# Patient Record
Sex: Female | Born: 1960 | Race: White | Hispanic: No | Marital: Married | Smoking: Current every day smoker
Health system: Southern US, Community
[De-identification: ages and names within clinical notes are randomized; demographics above are authoritative.]

## PROBLEM LIST (undated history)

## (undated) DIAGNOSIS — E119 Type 2 diabetes mellitus without complications: Secondary | ICD-10-CM

## (undated) DIAGNOSIS — M1990 Unspecified osteoarthritis, unspecified site: Secondary | ICD-10-CM

## (undated) DIAGNOSIS — E785 Hyperlipidemia, unspecified: Secondary | ICD-10-CM

## (undated) DIAGNOSIS — I10 Essential (primary) hypertension: Secondary | ICD-10-CM

## (undated) DIAGNOSIS — J449 Chronic obstructive pulmonary disease, unspecified: Secondary | ICD-10-CM

## (undated) HISTORY — PX: ABDOMINAL HYSTERECTOMY: SHX81

## (undated) HISTORY — PX: APPENDECTOMY: SHX54

## (undated) HISTORY — PX: TUBAL LIGATION: SHX77

## (undated) HISTORY — PX: CHOLECYSTECTOMY: SHX55

---

## 2015-03-21 DIAGNOSIS — D124 Benign neoplasm of descending colon: Secondary | ICD-10-CM | POA: Insufficient documentation

## 2016-07-15 DIAGNOSIS — J449 Chronic obstructive pulmonary disease, unspecified: Secondary | ICD-10-CM | POA: Insufficient documentation

## 2017-05-14 DIAGNOSIS — I10 Essential (primary) hypertension: Secondary | ICD-10-CM | POA: Insufficient documentation

## 2017-05-14 DIAGNOSIS — Z720 Tobacco use: Secondary | ICD-10-CM | POA: Insufficient documentation

## 2018-03-15 DIAGNOSIS — E119 Type 2 diabetes mellitus without complications: Secondary | ICD-10-CM | POA: Insufficient documentation

## 2018-06-02 DIAGNOSIS — G2581 Restless legs syndrome: Secondary | ICD-10-CM | POA: Insufficient documentation

## 2018-07-06 DIAGNOSIS — M170 Bilateral primary osteoarthritis of knee: Secondary | ICD-10-CM | POA: Insufficient documentation

## 2019-04-26 ENCOUNTER — Emergency Department (HOSPITAL_COMMUNITY): Payer: BC Managed Care – PPO

## 2019-04-26 ENCOUNTER — Emergency Department (HOSPITAL_COMMUNITY)
Admission: EM | Admit: 2019-04-26 | Discharge: 2019-04-26 | Disposition: A | Discharge status: Home / Self Care | Payer: BC Managed Care – PPO | Attending: Emergency Medicine | Admitting: Emergency Medicine

## 2019-04-26 ENCOUNTER — Encounter (HOSPITAL_COMMUNITY): Payer: Self-pay

## 2019-04-26 ENCOUNTER — Other Ambulatory Visit: Payer: Self-pay

## 2019-04-26 DIAGNOSIS — Z955 Presence of coronary angioplasty implant and graft: Secondary | ICD-10-CM | POA: Insufficient documentation

## 2019-04-26 DIAGNOSIS — R072 Precordial pain: Secondary | ICD-10-CM

## 2019-04-26 DIAGNOSIS — I259 Chronic ischemic heart disease, unspecified: Secondary | ICD-10-CM | POA: Insufficient documentation

## 2019-04-26 DIAGNOSIS — R002 Palpitations: Secondary | ICD-10-CM | POA: Diagnosis not present

## 2019-04-26 HISTORY — DX: Unspecified osteoarthritis, unspecified site: M19.90

## 2019-04-26 HISTORY — DX: Type 2 diabetes mellitus without complications: E11.9

## 2019-04-26 HISTORY — DX: Essential (primary) hypertension: I10

## 2019-04-26 HISTORY — DX: Chronic obstructive pulmonary disease, unspecified: J44.9

## 2019-04-26 HISTORY — DX: Hyperlipidemia, unspecified: E78.5

## 2019-04-26 LAB — CBC WITH DIFFERENTIAL/PLATELET
Abs Immature Granulocytes: 0.04 10*3/uL (ref 0.00–0.07)
Basophils Absolute: 0.1 10*3/uL (ref 0.0–0.1)
Basophils Relative: 1 %
Eosinophils Absolute: 0.1 10*3/uL (ref 0.0–0.5)
Eosinophils Relative: 1 %
HCT: 38 % (ref 36.0–46.0)
Hemoglobin: 12.2 g/dL (ref 12.0–15.0)
Immature Granulocytes: 0 %
Lymphocytes Relative: 17 %
Lymphs Abs: 2.2 10*3/uL (ref 0.7–4.0)
MCH: 30.7 pg (ref 26.0–34.0)
MCHC: 32.1 g/dL (ref 30.0–36.0)
MCV: 95.5 fL (ref 80.0–100.0)
Monocytes Absolute: 0.9 10*3/uL (ref 0.1–1.0)
Monocytes Relative: 7 %
Neutro Abs: 9.6 10*3/uL — ABNORMAL HIGH (ref 1.7–7.7)
Neutrophils Relative %: 74 %
Platelets: 372 10*3/uL (ref 150–400)
RBC: 3.98 MIL/uL (ref 3.87–5.11)
RDW: 13.3 % (ref 11.5–15.5)
WBC: 13 10*3/uL — ABNORMAL HIGH (ref 4.0–10.5)
nRBC: 0 % (ref 0.0–0.2)

## 2019-04-26 LAB — BASIC METABOLIC PANEL
Anion gap: 12 (ref 5–15)
BUN: 10 mg/dL (ref 6–20)
CO2: 22 mmol/L (ref 22–32)
Calcium: 9.3 mg/dL (ref 8.9–10.3)
Chloride: 97 mmol/L — ABNORMAL LOW (ref 98–111)
Creatinine, Ser: 0.94 mg/dL (ref 0.44–1.00)
GFR calc Af Amer: >60 mL/min (ref >60)
GFR calc non Af Amer: >60 mL/min (ref >60)
Glucose, Bld: 175 mg/dL — ABNORMAL HIGH (ref 70–99)
Potassium: 3.3 mmol/L — ABNORMAL LOW (ref 3.5–5.1)
Sodium: 131 mmol/L — ABNORMAL LOW (ref 135–145)

## 2019-04-26 LAB — TROPONIN I (HIGH SENSITIVITY)
Troponin I (High Sensitivity): <2 ng/L (ref <18)
Troponin I (High Sensitivity): 2 ng/L (ref <18)

## 2019-04-26 NOTE — ED Triage Notes (Signed)
Pt c/o CP that started around 0100 . 324 aspirin given. No ntg due to being slightly hypotensive. Slight ST depression on lead 2 on ems monitor.

## 2019-04-26 NOTE — ED Provider Notes (Signed)
[facility] EMERGENCY DEPARTMENT Provider Note   CSN: [id number] Arrival date & time: 04/26/19  0256     History Chief Complaint  Patient presents with  . Chest Pain    Molly Oneill is a 58 y.o. female.  The history is provided by the patient.  Chest Pain Pain location:  Substernal area Pain quality comment:  "heaviness" Pain radiates to:  Does not radiate Pain severity:  Severe Onset quality:  Sudden Timing:  Constant Progression:  Improving Chronicity:  New Relieved by:  Nothing Worsened by:  Nothing Ineffective treatments:  Aspirin Associated symptoms: diaphoresis, nausea, shortness of breath, syncope and vomiting     HPI: A 58 year old patient with a history of treated diabetes, hypertension and hypercholesterolemia presents for evaluation of chest pain. Initial onset of pain was less than one hour ago. The patient's chest pain is described as heaviness/pressure/tightness and is not worse with exertion. The patient complains of nausea. The patient's chest pain is middle- or left-sided, is not well-localized, is not sharp and does not radiate to the arms/jaw/neck. The patient denies diaphoresis. The patient has smoked in the past 90 days and has a family history of coronary artery disease in a first-degree relative with onset less than age 23. The patient has no history of stroke, has no history of peripheral artery disease and does not have an elevated BMI (>=30).  Patient reports she had sudden onset of chest heaviness, shortness of breath and vomiting.  She also reports diaphoresis.  She reports she may have "blacked out "for a brief period of time.  She is now feeling improved.  She was not given nitroglycerin by EMS due to mild hypotension.  Questionable ST depression on lead II on EMS monitor Patient reports feeling improved.  She has not had this recently.  No known history of CAD Past Medical History:  Diagnosis Date  . Arthritis   . COPD (chronic obstructive  pulmonary disease) ([place])   . Diabetes mellitus without complication ([place])   . Hyperlipidemia   . Hypertension     There are no problems to display for this patient.   Past Surgical History:  Procedure Laterality Date  . ABDOMINAL HYSTERECTOMY    . APPENDECTOMY    . CHOLECYSTECTOMY    . TUBAL LIGATION       OB History   No obstetric history on file.     History reviewed. No pertinent family history.  Social History   Tobacco Use  . Smoking status: Current Every Day Smoker  . Smokeless tobacco: Never Used  Substance Use Topics  . Alcohol use: Never  . Drug use: Never    Home Medications Prior to Admission medications   Not on File    Allergies    Patient has no allergy information on record.  Review of Systems   Review of Systems  Constitutional: Positive for diaphoresis.  Respiratory: Positive for shortness of breath.   Cardiovascular: Positive for chest pain and syncope.  Gastrointestinal: Positive for nausea and vomiting.  Neurological: Positive for light-headedness.  All other systems reviewed and are negative.   Physical Exam Updated Vital Signs BP (!) 144/63 (BP Location: Left Arm)   Pulse 68   Temp 97.8 F (36.6 C) (Oral)   Resp 14   Ht 1.626 m (5\' 4" )   Wt 76.7 kg   SpO2 99%   BMI 29.01 kg/m   Physical Exam CONSTITUTIONAL: Well developed/well nourished HEAD: Normocephalic/atraumatic EYES: EOMI ENMT: Poor dentition NECK: supple no  meningeal signs SPINE/BACK:entire spine nontender CV: S1/S2 noted, no murmurs/rubs/gallops noted LUNGS: Lungs are clear to auscultation bilaterally, no apparent distress ABDOMEN: soft, nontender, no rebound or guarding, bowel sounds noted throughout abdomen GU:no cva tenderness NEURO: Pt is awake/alert/appropriate, moves all extremitiesx4.  No facial droop.   EXTREMITIES: pulses normal/equalx4, full ROM SKIN: warm, color normal PSYCH: no abnormalities of mood noted, alert and oriented to situation  ED  Results / Procedures / Treatments   Labs (all labs ordered are listed, but only abnormal results are displayed) Labs Reviewed  BASIC METABOLIC PANEL - Abnormal; Notable for the following components:      Result Value   Sodium 131 (*)    Potassium 3.3 (*)    Chloride 97 (*)    Glucose, Bld 175 (*)    All other components within normal limits  CBC WITH DIFFERENTIAL/PLATELET - Abnormal; Notable for the following components:   WBC 13.0 (*)    Neutro Abs 9.6 (*)    All other components within normal limits  TROPONIN I (HIGH SENSITIVITY)  TROPONIN I (HIGH SENSITIVITY)    EKG EKG Interpretation  Date/Time:  Tuesday April 26 2019 03:03:27 EST Ventricular Rate:  68 PR Interval:    QRS Duration: 89 QT Interval:  417 QTC Calculation: 444 R Axis:   76 Text Interpretation: Sinus rhythm Abnormal ekg No previous ECGs available Confirmed by Zadie RhineWickline, Tonianne Fine ([id number]) on 04/26/2019 3:06:25 AM   Radiology DG Chest Port 1 View  Result Date: 04/26/2019 CLINICAL DATA:  Shortness of breath and chest pain EXAM: PORTABLE CHEST 1 VIEW COMPARISON:  04/04/2019 FINDINGS: The heart size and mediastinal contours are within normal limits. Both lungs are clear. The visualized skeletal structures are unremarkable. IMPRESSION: No active disease. Electronically Signed   By: Deatra RobinsonKevin  Herman M.D.   On: 04/26/2019 03:26    Procedures Procedures   Medications Ordered in ED Medications - No data to display  ED Course  I have reviewed the triage vital signs and the nursing notes.  Pertinent labs results that were available during my care of the patient were reviewed by me and considered in my medical decision making (see chart for details).    MDM Rules/Calculators/A&P HEAR Score: 5                    3:45 AM Patient with concerning history for ACS.  No previous records in our system.?ST depression in lead II, no STEMI noted 4:27 AM Patient well-appearing.  Initial troponin is negative. We will  continue to monitor. She is in no acute distress at this time 6:52 AM Repeat ekg unchanged  EKG Interpretation  Date/Time:  Tuesday April 26 2019 06:07:51 EST Ventricular Rate:  79 PR Interval:    QRS Duration: 85 QT Interval:  411 QTC Calculation: 472 R Axis:   74 Text Interpretation: Sinus rhythm Interpretation limited secondary to artifact No significant change since last tracing Confirmed by Zadie RhineWickline, Joahan Swatzell ([id number]) on 04/26/2019 6:18:45 AM      Patient slept through the ER stay.  Repeat troponin is unremarkable Since patient has had no recurrent chest pain, heart score of 5 with 2 negative troponins, she will be appropriate for discharge.  Patient is requesting discharge home. Low suspicion for ACS/dissection/PE.  However given her risk factors, I do feel outpatient follow-up with cardiology is warranted.  I strongly encourage patient to quit smoking. We discussed strict return precautions.  Teach back utilized with patient Final Clinical Impression(s) / ED Diagnoses Final diagnoses:  Precordial pain    Rx / DC Orders ED Discharge Orders    None       Zadie Rhine, MD 04/26/19 [phone]

## 2019-04-26 NOTE — Discharge Instructions (Signed)

## 2019-05-09 NOTE — Progress Notes (Signed)
CARDIOLOGY CONSULT NOTE       Patient ID: Molly Oneill MRN: [id number] DOB/AGE: 1960/06/27 59 y.o.  Admit date: (Not on file) Referring Physician: Bebe Shaggy [facility] Primary Physician: Salley Slaughter, NP Primary Cardiologist: New Reason for Consultation: Chest Pain  Active Problems:   * No active hospital problems. *   HPI:  59 y.o. CRFls include DM, HLD and HTN smokeing and family history of premature CAD  Seen in [facility] ER 04/26/19. Had heaviness/tightness in middle to left side of chest no radiation acute onset Associated with dyspnea and vomiting as well as diaphoresis Dizzy with low BP EMS did not give nitro Symptoms resolved spontaneously Reviewed all data from ER CXR with NAD HS troponin negative x 2 ECG no acute changes normal T waves and flat ST segments throughout NO ST depression or elevation. No old one to compare WBC elevated at 13 K 3.3 BS 175 Heart Score 5 Note indicates sleeping through ER visit and wanting to go home  Has not had recurrent symptoms since ER Smoking since age 44 sometimes 3 ppd lately only 1 ppd   ROS All other systems reviewed and negative except as noted above  Past Medical History:  Diagnosis Date  . Arthritis   . COPD (chronic obstructive pulmonary disease) ([facility])   . Diabetes mellitus without complication ([facility])   . Hyperlipidemia   . Hypertension     Family History  Problem Relation Age of Onset  . Heart failure Mother   . Diabetes Mother   . Hypertension Father     Social History   Socioeconomic History  . Marital status: Married    Spouse name: Not on file  . Number of children: Not on file  . Years of education: Not on file  . Highest education level: Not on file  Occupational History  . Not on file  Tobacco Use  . Smoking status: Current Every Day Smoker  . Smokeless tobacco: Never Used  Substance and Sexual Activity  . Alcohol use: Never  . Drug use: Never  . Sexual activity: Not Currently  Other Topics Concern  . Not on  file  Social History Narrative  . Not on file   Social Determinants of Health   Financial Resource Strain:   . Difficulty of Paying Living Expenses: Not on file  Food Insecurity:   . Worried About Programme researcher, broadcasting/film/video in the Last Year: Not on file  . Ran Out of Food in the Last Year: Not on file  Transportation Needs:   . Lack of Transportation (Medical): Not on file  . Lack of Transportation (Non-Medical): Not on file  Physical Activity:   . Days of Exercise per Week: Not on file  . Minutes of Exercise per Session: Not on file  Stress:   . Feeling of Stress : Not on file  Social Connections:   . Frequency of Communication with Friends and Family: Not on file  . Frequency of Social Gatherings with Friends and Family: Not on file  . Attends Religious Services: Not on file  . Active Member of Clubs or Organizations: Not on file  . Attends Banker Meetings: Not on file  . Marital Status: Not on file  Intimate Partner Violence:   . Fear of Current or Ex-Partner: Not on file  . Emotionally Abused: Not on file  . Physically Abused: Not on file  . Sexually Abused: Not on file    Past Surgical History:  Procedure Laterality Date  .  ABDOMINAL HYSTERECTOMY    . APPENDECTOMY    . CHOLECYSTECTOMY    . TUBAL LIGATION          Physical Exam: Blood pressure (!) 177/69, pulse 86, temperature 97.7 F (36.5 C), temperature source Temporal, height 5\' 4"  (1.626 m), weight 166 lb (75.3 kg), SpO2 98 %.   Affect appropriate Healthy:  appears stated age 23: normal Neck supple with no adenopathy JVP normal no bruits no thyromegaly Lungs rhonchi and exp wheezing and good diaphragmatic motion Heart:  S1/S2 no murmur, no rub, gallop or click PMI normal Abdomen: benighn, BS positve, no tenderness, no AAA no bruit.  No HSM or HJR previous abdominal surgeries  Distal pulses intact with no bruits No edema Neuro non-focal Skin warm and dry No muscular weakness   Labs:    Lab Results  Component Value Date   WBC 13.0 (H) 04/26/2019   HGB 12.2 04/26/2019   HCT 38.0 04/26/2019   MCV 95.5 04/26/2019   PLT 372 04/26/2019   No results for input(s): NA, K, CL, CO2, BUN, CREATININE, CALCIUM, PROT, BILITOT, ALKPHOS, ALT, AST, GLUCOSE in the last 168 hours.  Invalid input(s): LABALBU    Radiology: DG Chest Port 1 View  Result Date: 04/26/2019 CLINICAL DATA:  Shortness of breath and chest pain EXAM: PORTABLE CHEST 1 VIEW COMPARISON:  04/04/2019 FINDINGS: The heart size and mediastinal contours are within normal limits. Both lungs are clear. The visualized skeletal structures are unremarkable. IMPRESSION: No active disease. Electronically Signed   By: Ulyses Jarred M.D.   On: 04/26/2019 03:26    EKG: See HPI   ASSESSMENT AND PLAN:   1. Chest Pain:  Multiple coronary risk factors no acute findings Heart Score 5 f/u lexiscan myovue 2. Elevated WBC: ? Stress demargination will do f/u no focal infectious symptoms  3. Low K :  Not on diuretic vomited will repeat BMET 4. Smoking:  Counseled on cessation CXR ok will order lung cancer screening CT 5. DM:  Discussed low carb diet.  Target hemoglobin A1c is 6.5 or less.  Continue current medications.   CBC/BMET Lexiscan myovue Lung cancer screening CT  F/U with cardiology PRN if stress test normal  Signed: Jenkins Rouge 05/13/2019, 10:04 AM

## 2019-05-13 ENCOUNTER — Encounter: Payer: Self-pay | Admitting: Cardiovascular Disease

## 2019-05-13 ENCOUNTER — Ambulatory Visit (INDEPENDENT_AMBULATORY_CARE_PROVIDER_SITE_OTHER): Payer: BC Managed Care – PPO | Admitting: Cardiovascular Disease

## 2019-05-13 ENCOUNTER — Other Ambulatory Visit (HOSPITAL_COMMUNITY)
Admission: RE | Admit: 2019-05-13 | Discharge: 2019-05-13 | Disposition: A | Discharge status: Home / Self Care | Payer: BC Managed Care – PPO | Source: Ambulatory Visit | Attending: Cardiovascular Disease | Admitting: Cardiovascular Disease

## 2019-05-13 VITALS — BP 177/69 | HR 86 | Temp 97.7°F | Ht 64.0 in | Wt 166.0 lb

## 2019-05-13 DIAGNOSIS — F17200 Nicotine dependence, unspecified, uncomplicated: Secondary | ICD-10-CM

## 2019-05-13 DIAGNOSIS — R079 Chest pain, unspecified: Secondary | ICD-10-CM | POA: Insufficient documentation

## 2019-05-13 LAB — BASIC METABOLIC PANEL
Anion gap: 9 (ref 5–15)
BUN: 8 mg/dL (ref 6–20)
CO2: 23 mmol/L (ref 22–32)
Calcium: 9.5 mg/dL (ref 8.9–10.3)
Chloride: 99 mmol/L (ref 98–111)
Creatinine, Ser: 0.9 mg/dL (ref 0.44–1.00)
GFR calc Af Amer: >60 mL/min (ref >60)
GFR calc non Af Amer: >60 mL/min (ref >60)
Glucose, Bld: 111 mg/dL — ABNORMAL HIGH (ref 70–99)
Potassium: 4.3 mmol/L (ref 3.5–5.1)
Sodium: 131 mmol/L — ABNORMAL LOW (ref 135–145)

## 2019-05-13 LAB — CBC
HCT: 40.8 % (ref 36.0–46.0)
Hemoglobin: 13.4 g/dL (ref 12.0–15.0)
MCH: 31.1 pg (ref 26.0–34.0)
MCHC: 32.8 g/dL (ref 30.0–36.0)
MCV: 94.7 fL (ref 80.0–100.0)
Platelets: 399 10*3/uL (ref 150–400)
RBC: 4.31 MIL/uL (ref 3.87–5.11)
RDW: 13.4 % (ref 11.5–15.5)
WBC: 11.9 10*3/uL — ABNORMAL HIGH (ref 4.0–10.5)
nRBC: 0 % (ref 0.0–0.2)

## 2019-05-13 NOTE — Patient Instructions (Signed)
Medication Instructions:  Your physician recommends that you continue on your current medications as directed. Please refer to the Current Medication list given to you today.  *If you need a refill on your cardiac medications before your next appointment, please call your pharmacy*  Lab Work: CBC and BMET TODAY If you have labs (blood work) drawn today and your tests are completely normal, you will receive your results only by: Marland Kitchen MyChart Message (if you have MyChart) OR . A paper copy in the mail If you have any lab test that is abnormal or we need to change your treatment, we will call you to review the results.  Testing/Procedures: Your physician has requested that you have a lexiscan myoview. For further information please visit [email or web address] Please follow instruction sheet, as given.   Schedule Lung cancer screening CT scan  Follow-Up: Will be determined after your lexiscan and Chest CT       Thank you for choosing [facility] !

## 2019-05-25 ENCOUNTER — Ambulatory Visit (HOSPITAL_COMMUNITY)
Admission: RE | Admit: 2019-05-25 | Discharge: 2019-05-25 | Disposition: A | Discharge status: Home / Self Care | Payer: BC Managed Care – PPO | Source: Ambulatory Visit | Attending: Cardiovascular Disease | Admitting: Cardiovascular Disease

## 2019-05-25 ENCOUNTER — Encounter (HOSPITAL_COMMUNITY)
Admission: RE | Admit: 2019-05-25 | Discharge: 2019-05-25 | Disposition: A | Discharge status: Home / Self Care | Payer: BC Managed Care – PPO | Source: Ambulatory Visit | Attending: Cardiovascular Disease | Admitting: Cardiovascular Disease

## 2019-05-25 ENCOUNTER — Other Ambulatory Visit: Payer: Self-pay

## 2019-05-25 ENCOUNTER — Encounter (HOSPITAL_BASED_OUTPATIENT_CLINIC_OR_DEPARTMENT_OTHER)
Admission: RE | Admit: 2019-05-25 | Discharge: 2019-05-25 | Disposition: A | Discharge status: Home / Self Care | Payer: BC Managed Care – PPO | Source: Ambulatory Visit | Attending: Cardiovascular Disease | Admitting: Cardiovascular Disease

## 2019-05-25 DIAGNOSIS — F17200 Nicotine dependence, unspecified, uncomplicated: Secondary | ICD-10-CM

## 2019-05-25 DIAGNOSIS — R079 Chest pain, unspecified: Secondary | ICD-10-CM | POA: Diagnosis not present

## 2019-05-25 LAB — NM MYOCAR MULTI W/SPECT W/WALL MOTION / EF
LV dias vol: 63 mL (ref 46–106)
LV sys vol: 20 mL
Peak HR: 100 {beats}/min
RATE: 0.3
Rest HR: 77 {beats}/min
SDS: 2
SRS: 2
SSS: 4
TID: 1.04

## 2019-05-25 MED ORDER — TECHNETIUM TC 99M TETROFOSMIN IV KIT
30.0000 | PACK | Freq: Once | INTRAVENOUS | Status: AC | PRN
  Administered 2019-05-25: 30 via INTRAVENOUS

## 2019-05-25 MED ORDER — TECHNETIUM TC 99M TETROFOSMIN IV KIT
10.0000 | PACK | Freq: Once | INTRAVENOUS | Status: AC | PRN
  Administered 2019-05-25: 11 via INTRAVENOUS

## 2019-05-25 MED ORDER — REGADENOSON 0.4 MG/5ML IV SOLN
INTRAVENOUS | Status: AC
  Administered 2019-05-25: 0.4 mg via INTRAVENOUS
  Filled 2019-05-25: qty 5

## 2019-05-25 MED ORDER — SODIUM CHLORIDE FLUSH 0.9 % IV SOLN
INTRAVENOUS | Status: AC
  Administered 2019-05-25: 10:00:00 10 mL via INTRAVENOUS
  Filled 2019-05-25: qty 10

## 2019-06-15 NOTE — Progress Notes (Signed)
PT made aware. Copy to pcp.

## 2020-06-05 DIAGNOSIS — M797 Fibromyalgia: Secondary | ICD-10-CM | POA: Insufficient documentation

## 2020-06-11 DIAGNOSIS — K219 Gastro-esophageal reflux disease without esophagitis: Secondary | ICD-10-CM | POA: Insufficient documentation

## 2020-06-11 DIAGNOSIS — E782 Mixed hyperlipidemia: Secondary | ICD-10-CM | POA: Insufficient documentation

## 2020-06-11 DIAGNOSIS — J301 Allergic rhinitis due to pollen: Secondary | ICD-10-CM | POA: Insufficient documentation

## 2020-06-11 DIAGNOSIS — F5101 Primary insomnia: Secondary | ICD-10-CM | POA: Insufficient documentation

## 2020-09-25 DIAGNOSIS — M1990 Unspecified osteoarthritis, unspecified site: Secondary | ICD-10-CM

## 2020-09-25 HISTORY — DX: Unspecified osteoarthritis, unspecified site: M19.90

## 2021-01-12 NOTE — Congregational Nurse Program (Signed)
  Dept: [phone]   Congregational Nurse Program Note  Date of Encounter: 12/31/2020  Past Medical History: Past Medical History:  Diagnosis Date   Arthritis    COPD (chronic obstructive pulmonary disease) (HCC)    Diabetes mellitus without complication (HCC)    Hyperlipidemia    Hypertension     Encounter Details:  CNP Questionnaire - 12/31/20 1650       Questionnaire   Do you give verbal consent to treat you today? Yes    Visit Setting Church or Organization    Location Patient Served At [facility], [place]    Patient Status Not Applicable    Medical Provider Yes    Insurance Medicaid    Intervention Assess (including screenings)               Dept: [phone]   Congregational Nurse Program Note  Date of Encounter: 12/31/2020  Past Medical History: Past Medical History:  Diagnosis Date   Arthritis    COPD (chronic obstructive pulmonary disease) (HCC)    Diabetes mellitus without complication (HCC)    Hyperlipidemia    Hypertension     Encounter Details:  CNP Questionnaire - 12/31/20 1650       Questionnaire   Do you give verbal consent to treat you today? Yes    Visit Setting Church or Organization    Location Patient Served At [facility], [place]    Patient Status Not Applicable    Medical Provider Yes    Insurance Medicaid    Intervention Assess (including screenings)           No complaints or concerns Sees PCP on regular basis. BP  102/69; P 83. Valentina Alcoser RN, [place] [facility], [phone]    

## 2021-01-12 NOTE — Congregational Nurse Program (Signed)
  Dept: [phone]   Congregational Nurse Program Note  Date of Encounter: 12/31/2020  Past Medical History: Past Medical History:  Diagnosis Date   Arthritis    COPD (chronic obstructive pulmonary disease) (HCC)    Diabetes mellitus without complication (HCC)    Hyperlipidemia    Hypertension     Encounter Details:  CNP Questionnaire - 12/31/20 1650       Questionnaire   Do you give verbal consent to treat you today? Yes    Visit Setting Church or Organization    Location Patient Served At [employer], [facility]    Patient Status Not Applicable    Medical Provider Yes    Insurance Medicaid    Intervention Assess (including screenings)               Dept: [phone]   Congregational Nurse Program Note  Date of Encounter: 12/31/2020  Past Medical History: Past Medical History:  Diagnosis Date   Arthritis    COPD (chronic obstructive pulmonary disease) (HCC)    Diabetes mellitus without complication (HCC)    Hyperlipidemia    Hypertension     Encounter Details:  CNP Questionnaire - 12/31/20 1650       Questionnaire   Do you give verbal consent to treat you today? Yes    Visit Setting Church or Engineer, technical sales Patient Served At [employer], [employer]    Patient Status Not Applicable    Medical Provider Yes    Insurance Medicaid    Intervention Assess (including screenings)           No complaints or concerns Sees PCP on regular basis. BP  102/69; P 83. Jenene Slicker RN, [place], [phone]

## 2021-06-27 IMAGING — DX DG CHEST 1V PORT
1 series · 1 of 1 positions shown · non-contrast
Comparison: 04/04/2019

CLINICAL DATA: Shortness of breath and chest pain

EXAM:
PORTABLE CHEST 1 VIEW

[chest ap]
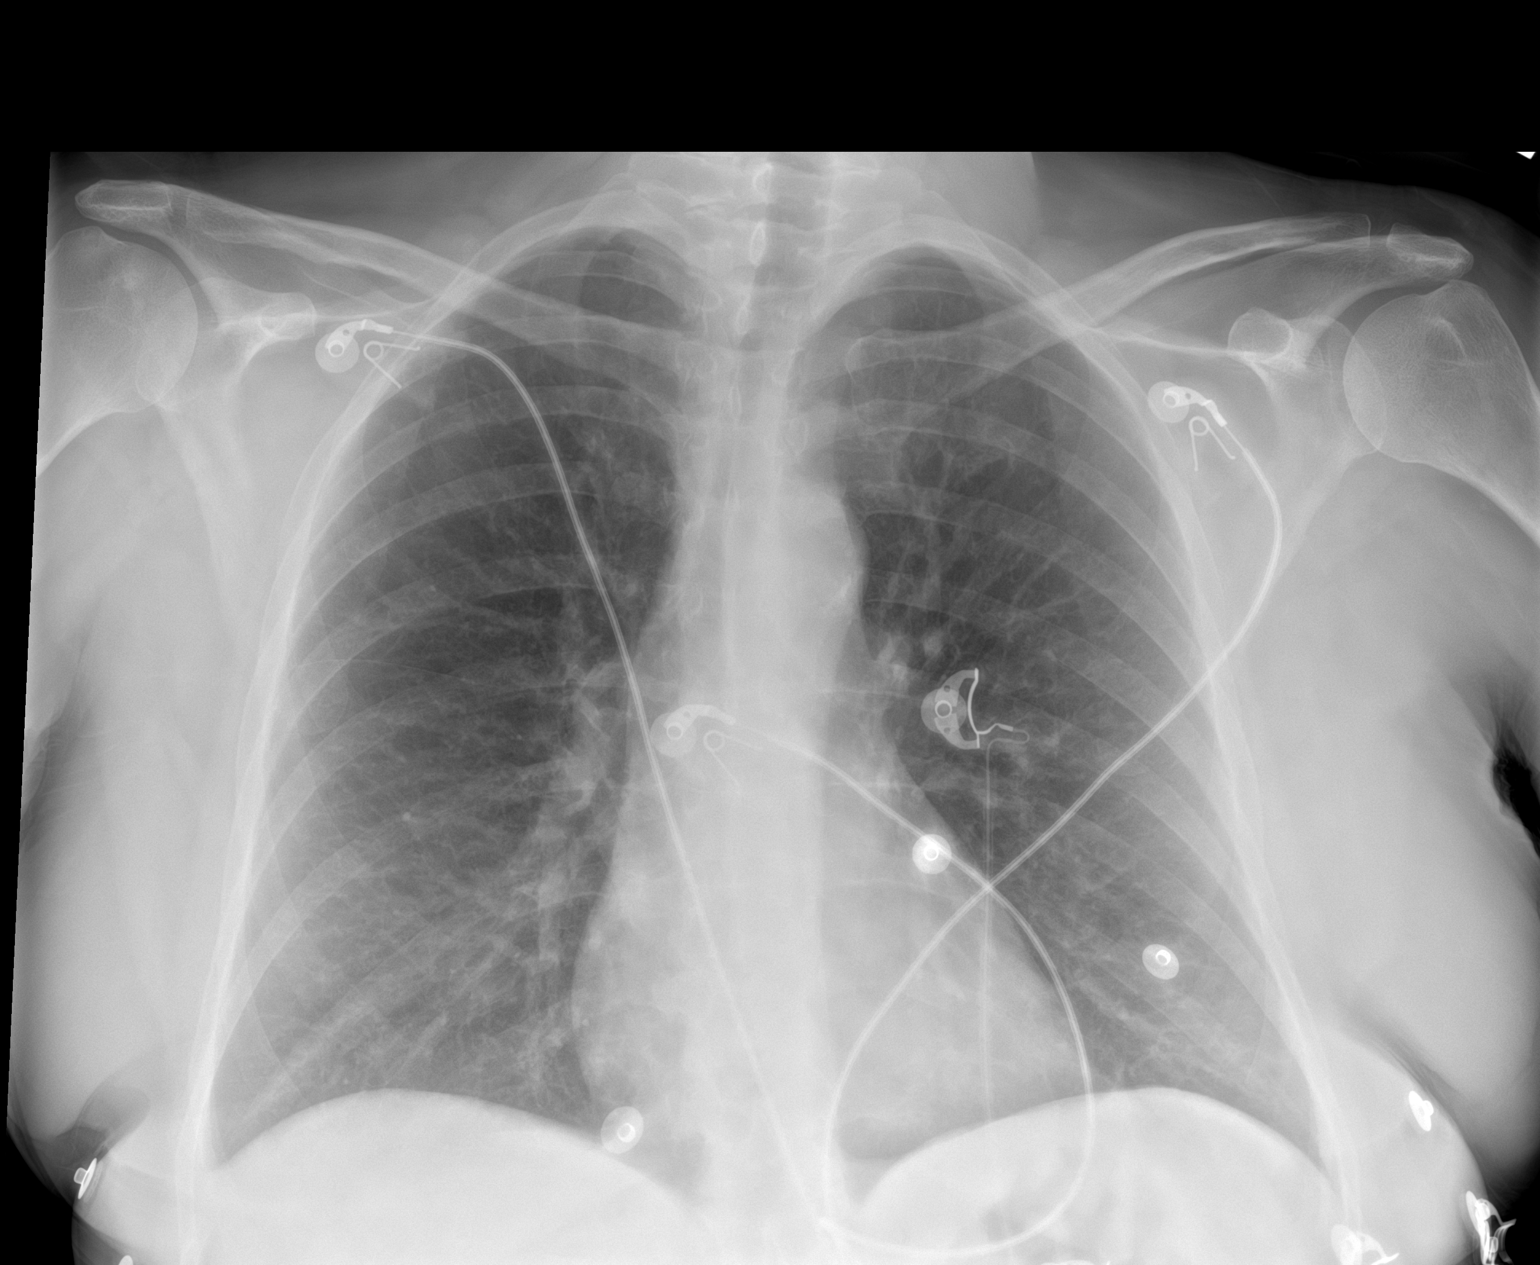

[1 of 1 positions shown; findings below may reference images not displayed]

FINDINGS: The heart size and mediastinal contours are within normal limits.
Both lungs are clear. The visualized skeletal structures are
unremarkable.
IMPRESSION: No active disease.

## 2021-07-26 IMAGING — CT CT CHEST LUNG CANCER SCREENING LOW DOSE W/O CM
2 of 3 series · 15 of 36 positions shown, 18 images · non-contrast
Comparison: None.

CLINICAL DATA: Current smoker, 129 pack-year history.

EXAM:
CT CHEST WITHOUT CONTRAST LOW-DOSE FOR LUNG CANCER SCREENING
TECHNIQUE: Multidetector CT imaging of the chest was performed following the
standard protocol without IV contrast.

[Series 2: axial st · axial · 0.60mm/px · z∈[-280,-50]mm · 12 of 55 slices shown, 15 images]
[im 5/55  mediastinal]
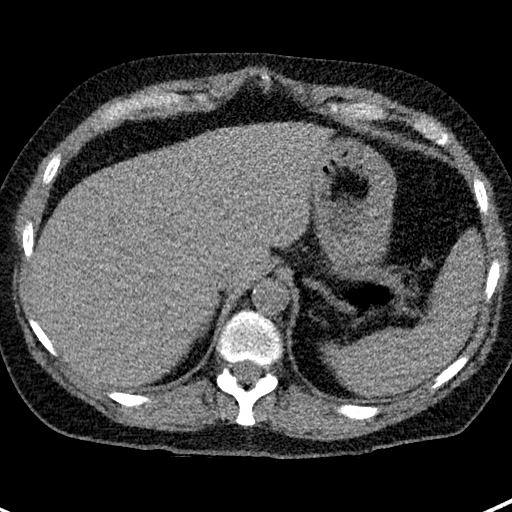
[im 5/55  lung]
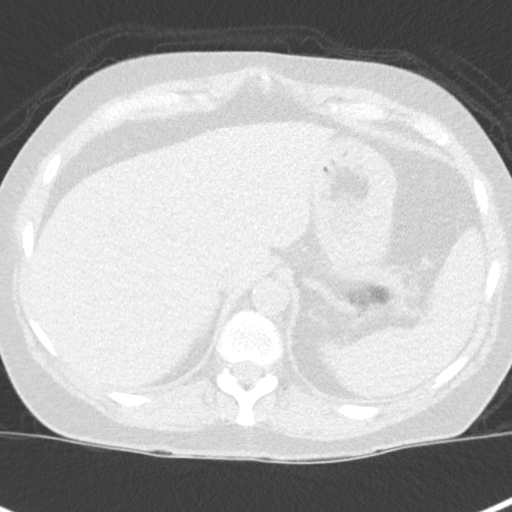
[im 9/55  lung]
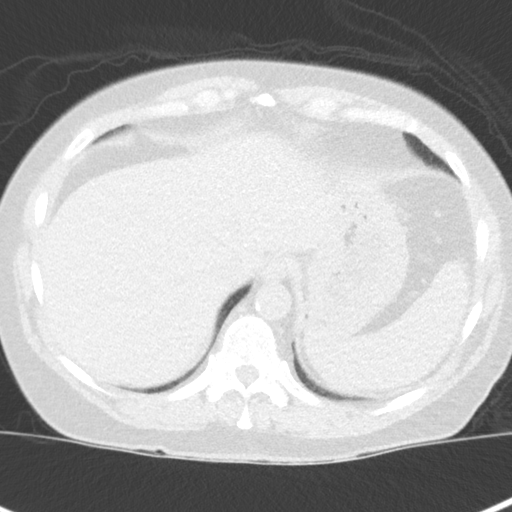
[im 13/55  lung]
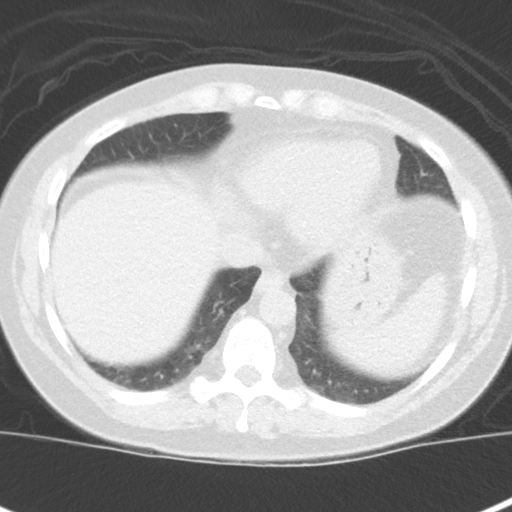
[im 17/55  lung]
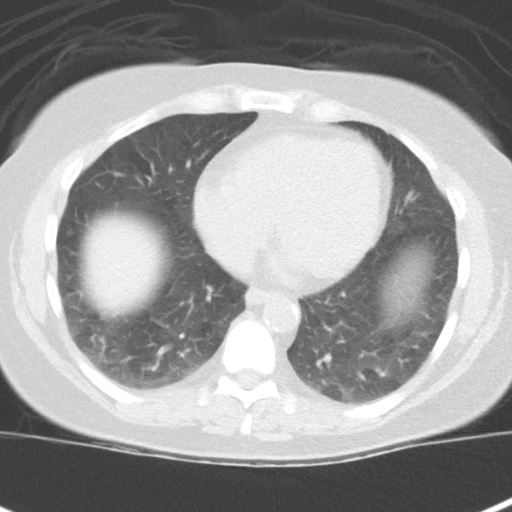
[im 21/55  mediastinal]
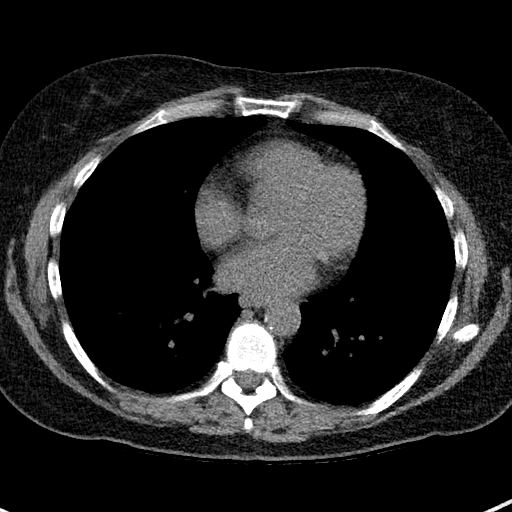
[im 21/55  lung]
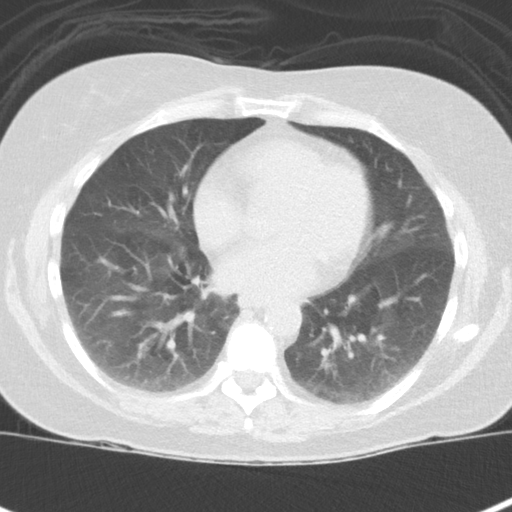
[im 25/55  lung]
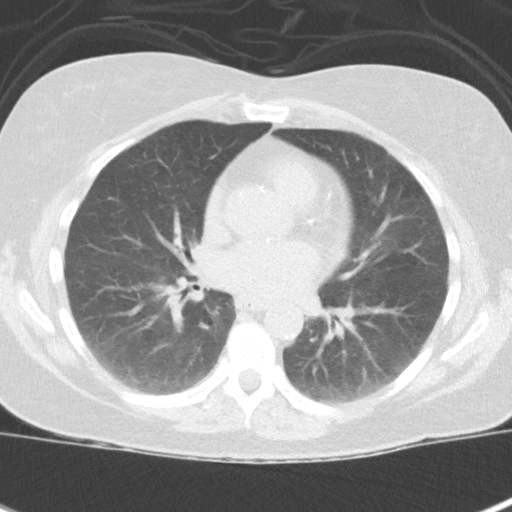
[im 31/55  lung]
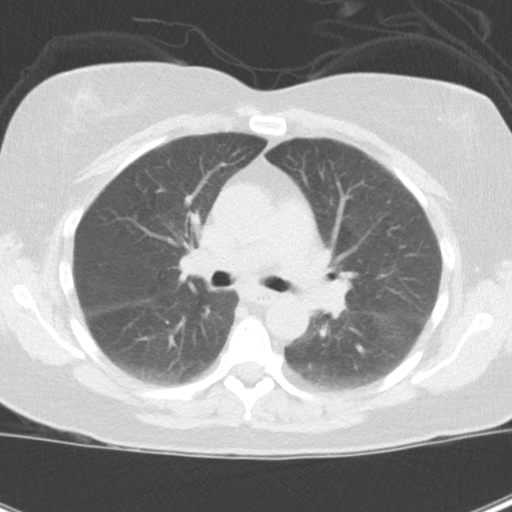
[im 35/55  lung]
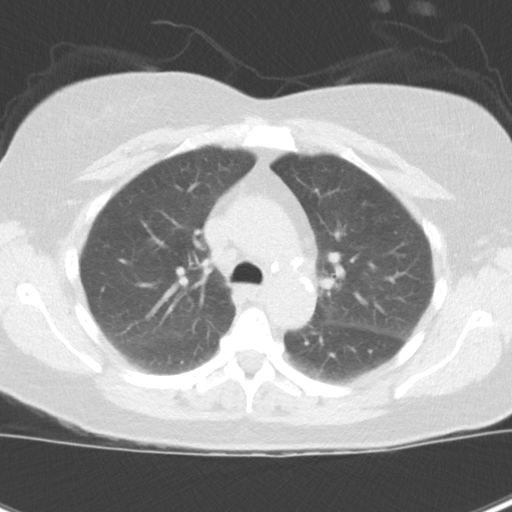
[im 39/55  mediastinal]
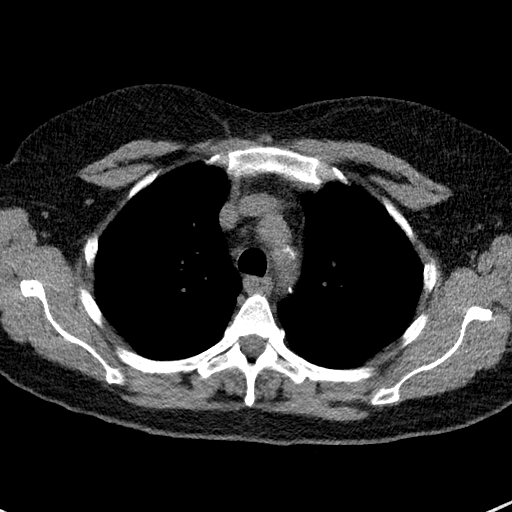
[im 39/55  lung]
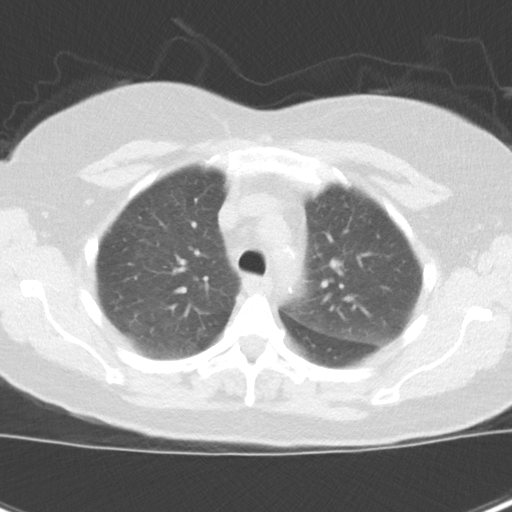
[im 43/55  lung]
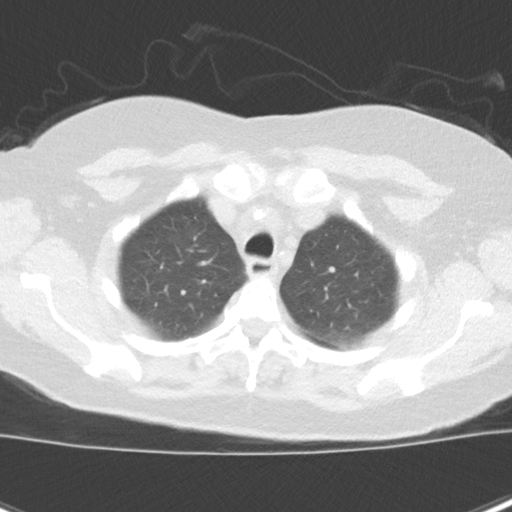
[im 47/55  lung]
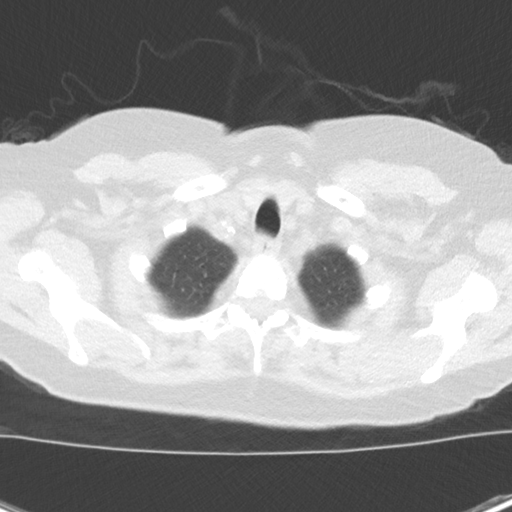
[im 51/55  lung]
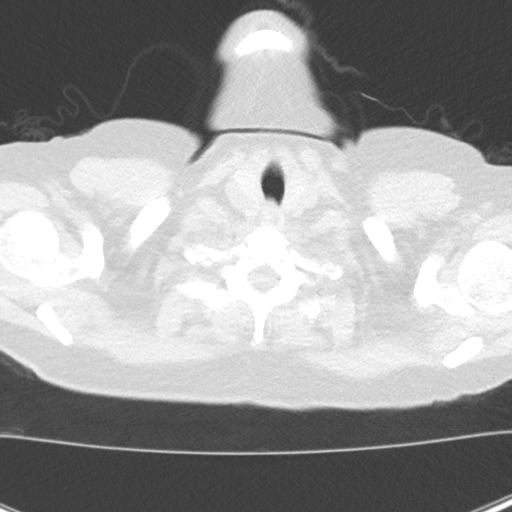

[Series 5: coronal · coronal · 0.55mm/px · 3 of 243 slices shown]
[im 49/243  lung]
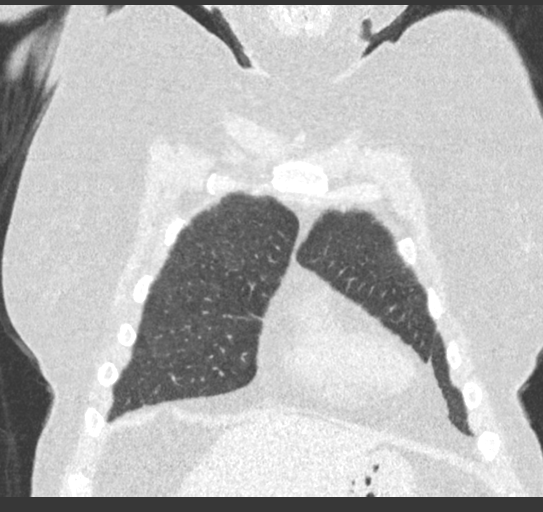
[im 97/243  lung]
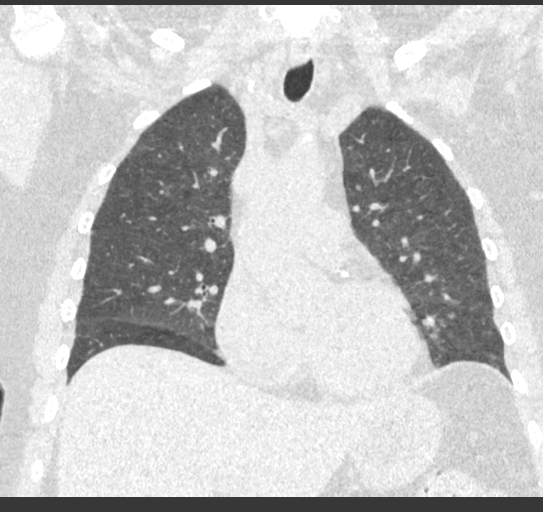
[im 146/243  lung]
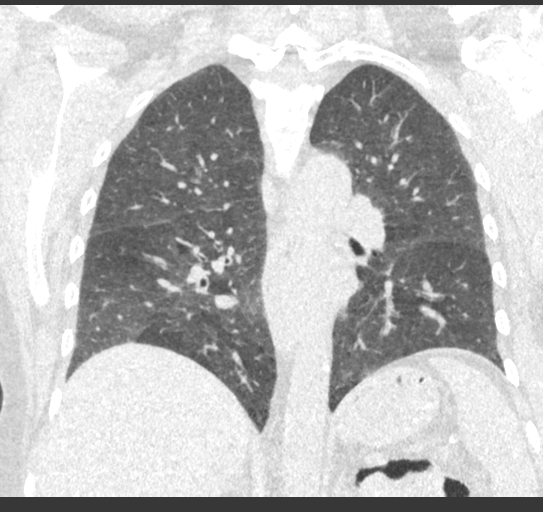

[15 of 36 positions shown; findings below may reference images not displayed]

FINDINGS: Cardiovascular: Atherosclerotic calcification of the aorta, aortic
valve and coronary arteries. Heart size normal. No pericardial
effusion.

Mediastinum/Nodes: No pathologically enlarged mediastinal or
axillary lymph nodes. Hilar regions are difficult to evaluate
without IV contrast but appear grossly unremarkable. Esophagus is
grossly unremarkable.

Lungs/Pleura: Centrilobular emphysema. Smoking related respiratory
bronchiolitis. Pulmonary nodules measure 5.4 mm or less in size. No
pleural fluid. Airway is unremarkable.

Upper Abdomen: Visualized portions of the liver, adrenal glands,
spleen, stomach and bowel are grossly unremarkable.

Musculoskeletal: No worrisome lytic or sclerotic lesions.
IMPRESSION: 1. Lung-RADS 2, benign appearance or behavior. Continue annual
screening with low-dose chest CT without contrast in 12 months.
2. Aortic atherosclerosis (0F51Y-KBH.H). Coronary artery
calcification.
3.  Emphysema (0F51Y-ZCT.0).

## 2021-11-28 DIAGNOSIS — G4730 Sleep apnea, unspecified: Secondary | ICD-10-CM | POA: Insufficient documentation

## 2023-03-17 DIAGNOSIS — E559 Vitamin D deficiency, unspecified: Secondary | ICD-10-CM | POA: Insufficient documentation

## 2023-03-17 DIAGNOSIS — I70218 Atherosclerosis of native arteries of extremities with intermittent claudication, other extremity: Secondary | ICD-10-CM | POA: Insufficient documentation

## 2023-09-15 DIAGNOSIS — F32A Depression, unspecified: Secondary | ICD-10-CM | POA: Insufficient documentation

## 2023-09-15 DIAGNOSIS — F419 Anxiety disorder, unspecified: Secondary | ICD-10-CM | POA: Insufficient documentation

## 2023-09-15 DIAGNOSIS — F319 Bipolar disorder, unspecified: Secondary | ICD-10-CM | POA: Insufficient documentation

## 2023-10-28 ENCOUNTER — Ambulatory Visit: Payer: MEDICAID | Admitting: Family Medicine

## 2023-10-28 ENCOUNTER — Encounter: Payer: Self-pay | Admitting: Family Medicine

## 2023-10-28 VITALS — BP 116/60 | HR 86 | Temp 97.7°F | Ht 64.0 in | Wt 202.0 lb

## 2023-10-28 DIAGNOSIS — Z794 Long term (current) use of insulin: Secondary | ICD-10-CM

## 2023-10-28 DIAGNOSIS — E782 Mixed hyperlipidemia: Secondary | ICD-10-CM

## 2023-10-28 DIAGNOSIS — G894 Chronic pain syndrome: Secondary | ICD-10-CM

## 2023-10-28 DIAGNOSIS — I10 Essential (primary) hypertension: Secondary | ICD-10-CM

## 2023-10-28 DIAGNOSIS — I2510 Atherosclerotic heart disease of native coronary artery without angina pectoris: Secondary | ICD-10-CM | POA: Insufficient documentation

## 2023-10-28 DIAGNOSIS — E1159 Type 2 diabetes mellitus with other circulatory complications: Secondary | ICD-10-CM

## 2023-10-28 MED ORDER — SEMAGLUTIDE (2 MG/DOSE) 8 MG/3ML ~~LOC~~ SOPN: 2.0000 mg | PEN_INJECTOR | SUBCUTANEOUS | 1 refills | Status: AC

## 2023-10-28 MED ORDER — LANTUS SOLOSTAR 100 UNIT/ML ~~LOC~~ SOPN: 50.0000 [IU] | PEN_INJECTOR | Freq: Every day | SUBCUTANEOUS | 3 refills | Status: AC

## 2023-10-28 NOTE — Patient Instructions (Signed)
 Medications refilled.  Referral to pain management placed.  Follow up in 3 months.

## 2023-10-29 ENCOUNTER — Encounter: Payer: Self-pay | Admitting: Family Medicine

## 2023-10-29 DIAGNOSIS — E119 Type 2 diabetes mellitus without complications: Secondary | ICD-10-CM | POA: Insufficient documentation

## 2023-10-29 DIAGNOSIS — G894 Chronic pain syndrome: Secondary | ICD-10-CM | POA: Insufficient documentation

## 2023-10-29 NOTE — Assessment & Plan Note (Signed)
Referring to pain management

## 2023-10-29 NOTE — Progress Notes (Signed)
 Subjective:  Patient ID: Molly Oneill, female    DOB: 27-Apr-1961  Age: 63 y.o. MRN: [id number]  CC:   Chief Complaint  Patient presents with   New Patient (Initial Visit)    Establish care and referral to pain mgt.     HPI:  63 year old female presents as a new patient.  Patient has been followed by [facility] and states that she is switching care.  She was just seen 06/23.  We need an accurate medication list as what we have is different from what was recently reconciled at her last visit at [facility].  Patient does state that she needs her insulin and semaglutide.  She has type 2 diabetes and most recent A1c was 7.3 on 07/13/2023.  Denies hypoglycemia.  No reported side effects from semaglutide.  Patient has fibromyalgia and chronic pain.  She has been treated with chronic opioid therapy.  Last prescription was in May by a provider at [facility] .  Patient would like to have referral to local pain management.  Hypertension stable.  Most recent LDL was 49.  She is on statin therapy.  Patient is a current smoker.  Patient Active Problem List   Diagnosis Date Noted   Chronic pain syndrome 10/29/2023   Type 2 diabetes mellitus (HCC) 10/29/2023   CAD (coronary artery disease) 10/28/2023   Anxiety 09/15/2023   Bipolar 1 disorder (HCC) 09/15/2023   Claudication of left upper extremity due to atherosclerosis (HCC) 03/17/2023   Vitamin D deficiency 03/17/2023   Sleep apnea in adult 11/28/2021   Allergic rhinitis due to pollen 06/11/2020   Gastroesophageal reflux disease 06/11/2020   Hyperlipidemia, mixed 06/11/2020   Primary insomnia 06/11/2020   Fibromyalgia 06/05/2020   Osteoarthritis of both knees 07/06/2018   Restless legs syndrome 06/02/2018   Essential hypertension 05/14/2017   Tobacco abuse 05/14/2017   Chronic obstructive pulmonary disease (HCC) 07/15/2016   Benign neoplasm of descending colon 03/21/2015    Social Hx   Social History    Socioeconomic History   Marital status: Married    Spouse name: Not on file   Number of children: Not on file   Years of education: Not on file   Highest education level: Not on file  Occupational History   Not on file  Tobacco Use   Smoking status: Every Day   Smokeless tobacco: Never  Vaping Use   Vaping status: Never Used  Substance and Sexual Activity   Alcohol use: Yes    Comment: twice a year   Drug use: Never   Sexual activity: Not Currently  Other Topics Concern   Not on file  Social History Narrative   Not on file   Social Drivers of Health   Financial Resource Strain: Medium Risk (07/13/2023)   Received from [facility]   Overall Financial Resource Strain (CARDIA)    Difficulty of Paying Living Expenses: Somewhat hard  Food Insecurity: No Food Insecurity (07/13/2023)   Received from [facility]   Hunger Vital Sign    Within the past 12 months, you worried that your food would run out before you got the money to buy more.: Never true    Within the past 12 months, the food you bought just didn't last and you didn't have money to get more.: Never true  Transportation Needs: No Transportation Needs (07/13/2023)   Received from [facility] - Transportation    Lack of Transportation (Medical): No  Lack of Transportation (Non-Medical): No  Physical Activity: Inactive (07/13/2023)   Received from [facility]   Exercise Vital Sign    On average, how many days per week do you engage in moderate to strenuous exercise (like a brisk walk)?: 0 days    On average, how many minutes do you engage in exercise at this level?: 0 min  Stress: Stress Concern Present (07/13/2023)   Received from [facility] - Occupational Stress Questionnaire    Feeling of Stress : Very much  Social Connections: Socially Integrated (10/26/2023)   Received from [facility]   Social Connection and Isolation Panel    In a  typical week, how many times do you talk on the phone with family, friends, or neighbors?: Three times a week    How often do you get together with friends or relatives?: More than three times a week    How often do you attend church or religious services?: 1 to 4 times per year    Do you belong to any clubs or organizations such as church groups, unions, fraternal or athletic groups, or school groups?: Yes    How often do you attend meetings of the clubs or organizations you belong to?: 1 to 4 times per year    Are you married, widowed, divorced, separated, never married, or living with a partner?: Married    Review of Systems Per HPI  Objective:  BP 116/60   Pulse 86   Temp 97.7 F (36.5 C)   Ht 5' 4 (1.626 m)   Wt 202 lb (91.6 kg)   SpO2 95%   BMI 34.67 kg/m      10/28/2023    2:10 PM 05/13/2019    9:55 AM 04/26/2019    4:00 AM  BP/Weight  Systolic BP 116 177 126  Diastolic BP 60 69 61  Wt. (Lbs) 202 166   BMI 34.67 kg/m2 28.49 kg/m2     Physical Exam Vitals and nursing note reviewed.  Constitutional:      Appearance: Normal appearance. She is obese.  HENT:     Head: Normocephalic and atraumatic.   Eyes:     General:        Right eye: No discharge.        Left eye: No discharge.     Conjunctiva/sclera: Conjunctivae normal.    Cardiovascular:     Rate and Rhythm: Normal rate and regular rhythm.  Pulmonary:     Effort: Pulmonary effort is normal.     Breath sounds: Normal breath sounds. No wheezing, rhonchi or rales.   Neurological:     Mental Status: She is alert.   Psychiatric:        Mood and Affect: Mood normal.        Behavior: Behavior normal.     Lab Results  Component Value Date   WBC 11.9 (H) 05/13/2019   HGB 13.4 05/13/2019   HCT 40.8 05/13/2019   PLT 399 05/13/2019   GLUCOSE 111 (H) 05/13/2019   NA 131 (L) 05/13/2019   K 4.3 05/13/2019   CL 99 05/13/2019   CREATININE 0.90 05/13/2019   BUN 8 05/13/2019   CO2 23 05/13/2019      Assessment & Plan:  Type 2 diabetes mellitus with other circulatory complication, with long-term current use of insulin (HCC) Assessment & Plan: Most recent A1c fairly well-controlled.  Medications refilled today.  Orders: -  Semaglutide (2 MG/DOSE); Inject 2 mg into the skin once a week.  Dispense: 9 mL; Refill: 1 -     Lantus SoloStar; Inject 50 Units into the skin at bedtime.  Dispense: 45 mL; Refill: 3  Chronic pain syndrome Assessment & Plan: Referring to pain management.  Orders: -     Ambulatory referral to Pain Clinic  Essential hypertension Assessment & Plan: Hypertension stable.  Patient will continue her current prescribed medications.  Will reach out to the pharmacy for clarification on what she is currently taking.  She does not have her medications with her today.   Hyperlipidemia, mixed Assessment & Plan: At goal on statin.  Continue.     Follow-up:  3 months  Shyne Lehrke Bluford DO [facility]

## 2023-10-29 NOTE — Assessment & Plan Note (Signed)
 Hypertension stable.  Patient will continue her current prescribed medications.  Will reach out to the pharmacy for clarification on what she is currently taking.  She does not have her medications with her today.

## 2023-10-29 NOTE — Assessment & Plan Note (Signed)
At goal on statin.  Continue.

## 2023-10-29 NOTE — Assessment & Plan Note (Signed)
 Most recent A1c fairly well-controlled.  Medications refilled today.

## 2023-11-30 ENCOUNTER — Telehealth: Payer: Self-pay | Admitting: *Deleted

## 2023-11-30 NOTE — Telephone Encounter (Signed)
 Copied from CRM [id number]. Topic: Clinical - Medication Question >> Nov 30, 2023 11:25 AM Molly Oneill wrote: Reason for CRM: Pt daughter pinkie) wants to know who the declined the approval for tizanidine 4mg ? Molly Oneill is requesting a call back at [phone]

## 2023-11-30 NOTE — Telephone Encounter (Signed)
 Copied from CRM [id number]. Topic: Clinical - Medication Prior Auth >> Nov 30, 2023 11:18 AM Molly Oneill wrote: Reason for CRM: Pt daughter Molly Oneill) is calling b/c [facility] is requesting a prescriber approval for Ziprasidone 40mg . This medicine is not listed on the patient chart. Pt can be contacted at [phone] [facility]  phone number is [phone]   Pharmacy Details [facility] [phone] [address] [place] [address] Phone: [phone] Fax: [phone]

## 2023-12-14 ENCOUNTER — Encounter: Payer: Self-pay | Admitting: Family Medicine

## 2023-12-21 ENCOUNTER — Ambulatory Visit: Payer: MEDICAID | Admitting: Family Medicine

## 2024-02-07 NOTE — Discharge Summary (Signed)
 DISCHARGE SUMMARY [facility]   Discharge date:   February 07, 2024 Length of stay:    LOS: 0 days    Discharge Service:   [facility] Hospitalists Discharge Attending Physician: Donnice Manus Para, DO Discharge to:    To Home Condition at Discharge:  good Code status:                         Full Code   Hospital Course: 10/5: Patient admitted overnight due to low blood pressures.  These improved with IV fluids and holding patient's home blood pressure medications.  She was stable for discharge in the afternoon.  ______________________________________  Admission HPI    Patient admitted on: 02/06/2024  8:35 PM  Patient admitted by: Dr. Verdie   CHIEF COMPLAINT: Low blood pressure   Day of admission HPI:  Molly Oneill  is a 63 y.o. female with a PMH significant for T2DM, HTN, tobacco dependence, bipolar disorder 1, GAD, osteoarthritis, history of colon cancer, COPD, depression, HLD, lumbar and cervical radiculopathy, psoriasis, HLD, RLS, rheumatoid arthritis, OSA and vitamin D deficiency who presents to the ED status post acute onset of hypotensive episodes.  The patient reports that she was at home and in her usual state of health earlier today but noticed that she became somewhat dizzy later in the afternoon particularly with standing.  She states that she just did not feel right and given the sudden nature of her symptoms she proceeded to check her blood pressure and her systolic pressures were in the 60s.  She reports that she attempted to get up again but again was dizzy and she states I could not stand up.  She clarified that she could physically stand up but felt as if she would pass out if she did.  She denies any loss of consciousness, palpitations, chest pain, blurred vision, headaches,.  She does report approximately 3 days ago she did have what sounds like a gastrointestinal illness likely viral, where she did have a couple of days of nausea, vomiting and diarrhea.  She  reports that she feels this condition had resolved entirely and did not associate it with today's episode.  On further evaluation she denies any recent URI, urinary symptoms or other acute illness.  She denies any fever, chills, suprapubic or acute low back pain.   Workup in the ED is significant for leukocytosis of 14.2 but states that she has had elevation in her WBC for years and has seen hematology for this, she is unable to provide any specific details outlining the etiology.  Her creatinine is slightly elevated at 1.16 and UDS is positive for opiates.  CT of the chest without contrast reveals faint scattered ground glass pulmonary opacities which can represent atypical or viral infection     Patient admitted on Home O2? - no Patient on home anticoagulant? -  no Patient admitted with Chronic home foley catheter? - no Foley catheter placed or replaced by another service prior to admission? - no Central Line Status: NONE   Mental Status on Admission: The patient is Alert and oriented to PERSON The patient is Alert And oriented to TIME The patient is Alert and oriented to LOCATION     Problem List, Assessment & Plan     ASSESSMENT & PLAN (In order of descending acuity)   Hypotension - In the setting of likely dehydration and blood pressure medication use, she did recently have a viral gastroenteritis - Patient states  he was recently started on Jardiance, and she has been urinating quite a lot - She has also had polydipsia, and has been drinking a lot of fluids - I do not want to stop patient's Jardiance as it is likely helping with her blood sugar control - Will stop patient's atenolol and lisinopril - Instructed patient to continue aggressive oral hydration - Patient wishes to leave the hospital on 10/5, and I see no contraindication to this, she is discharged home - Follow-up with primary care provider within a week   Acute renal insufficiency, ruled out -Creatinine is close to  baseline   T2DM SSI, serial blood glucose monitoring and diabetic diet. - Resume home diabetic medications   HTN Resume home antihypertensive regimen failure.   Bipolar disorder type I Continue home regimen.   GAD/depression Continue home regimen when available.   COPD Continue maintenance inhalers, does not appear to be in exacerbation at this time, monitor for any symptomatic change and address as indicated.   HLD Continue statin.   ROS Continue home medication.   OSA CPAP to be continued per home settings as applicable.   DVT PPx: Lovenox     ADDITIONAL NON-ACUTE FINDINGS, OBSERVATIONS, FAMILY DISCUSSIONS, ETC. (When present):   Resting comfortably in bed, alert and oriented x 4 Heart regular rate and rhythm, no murmurs rubs or gallops Lungs clear to auscultation   Incidental Findings for ourpatient Follow-Up: No significant incidental findings present    DVT Prophylaxis Ordered: SQ Enoxaparin ______________________________________  Mental Status On day of Discharge:  The patient is Alert and oriented to PERSON The patient is Alert And oriented to TIME The patient is Alert and oriented to LOCATION  CODE STATUS :                    Full Code   An advanced care planning discussion was  had with patient and/or patient's decisions maker (documented separately).  Patient discharged on Home O2? - no Patient discharged on home anticoagulant? -  no  Foley Catheter status: None Central Line Status: NONE  Time Spent on Discharge I spent greater than 30 minutes counseling and coordinating care for the discharge of this patient. The patient, daughter and I discussed the importance of outpatient follow-up as well as concerning signs and symptoms that would require immediate evaluation by a medical professional. The aforementioned conversation participants understand  and did show insight. I did use teachback to ensure understanding. The above participant/s is aware  that not following the discussed plan, recommendations, and follow up can lead to severe negative effects on the patient's health, up to and including death.  Discharge Medications     Your Medication List     STOP taking these medications    atenolol 25 MG tablet Commonly known as: TENORMIN   lisinopril 2.5 MG tablet Commonly known as: PRINIVIL,ZESTRIL       CONTINUE taking these medications    ABOUTTIME PEN NEEDLE 31 gauge x 5/16 (8 mm) Ndle Generic drug: pen needle, diabetic   budesonide-formoterol 160-4.5 mcg/actuation inhaler Commonly known as: SYMBICORT Inhale 2 puffs two (2) times a day.   cholecalciferol (vitamin D3-125 mcg (5,000 unit)) 125 mcg (5,000 unit) tablet Take 1 tablet (125 mcg total) by mouth every morning.   clopidogrel 75 mg tablet Commonly known as: PLAVIX Take 1 tablet (75 mg total) by mouth daily.   docusate sodium 100 MG capsule Commonly known as: COLACE Take 1 capsule (100 mg total) by mouth two (  2) times a day as needed for constipation.   DULoxetine 60 MG capsule Commonly known as: CYMBALTA Take 1 capsule (60 mg total) by mouth daily.   empagliflozin 10 mg tablet Commonly known as: JARDIANCE Take 1 tablet (10 mg total) by mouth daily.   ezetimibe 10 mg tablet Commonly known as: ZETIA Take 1 tablet (10 mg total) by mouth every morning.   famotidine 20 MG tablet Commonly known as: PEPCID Take 1 tablet (20 mg total) by mouth two (2) times a day.   fluticasone propionate 50 mcg/actuation nasal spray Commonly known as: FLONASE 1 spray into each nostril two (2) times a day.   folic acid 1 MG tablet Commonly known as: FOLVITE Take 1 tablet (1 mg total) by mouth two (2) times a day.   HYDROcodone-acetaminophen 5-325 mg per tablet Commonly known as: NORCO Take 1 tablet by mouth daily as needed.   insulin glargine  100 unit/mL injection Commonly known as: LANTUS  Inject 0.5 mL (50 Units total) under the skin nightly.    lamoTRIgine 25 MG tablet Commonly known as: LaMICtal TAKE 2 TABLETS(50 MG) BY MOUTH TWICE DAILY   latanoprost 0.005 % ophthalmic solution Commonly known as: XALATAN Administer 1 drop into the left eye nightly.   loperamide 2 mg tablet Commonly known as: IMODIUM A-D Take 1 tablet (2 mg total) by mouth four (4) times a day as needed for diarrhea.   loratadine 10 mg tablet Commonly known as: CLARITIN Take 1 tablet (10 mg total) by mouth every morning.   methotrexate 2.5 MG tablet Take by mouth once a week.   nitroglycerin 0.4 MG SL tablet Commonly known as: NITROSTAT Place 1 tablet (0.4 mg total) under the tongue every five (5) minutes as needed for chest pain. Maximum of 3 doses in 15 minutes.   omeprazole 40 MG capsule Commonly known as: PriLOSEC Take 1 capsule (40 mg total) by mouth daily.   OZEMPIC  2 mg/dose (8 mg/3 mL) Pnij Generic drug: semaglutide  Inject 2 mg under the skin every seven (7) days.   potassium 99 mg Tab Take 1 tablet by mouth in the morning.   pregabalin 225 MG capsule Commonly known as: LYRICA Take 1 capsule (225 mg total) by mouth two (2) times a day.   QUEtiapine 300 MG tablet Commonly known as: SEROQUEL Take 1 tablet (300 mg total) by mouth nightly.   rOPINIRole 4 MG tablet Commonly known as: REQUIP Take 1 tablet (4 mg total) by mouth nightly.   rosuvastatin 40 MG tablet Commonly known as: CRESTOR Take 1 tablet (40 mg total) by mouth nightly.   umeclidinium 62.5 mcg/actuation inhaler Commonly known as: INCRUSE ELLIPTA Inhale 1 puff in the morning.   varenicline tartrate 0.5 mg (11)- 1 mg (42) tablet Commonly known as: CHANTIX PAK Take one 0.5mg  tab once daily for 3 days,then increase to one 0.5mg  tab twice daily for 4 days,then increase to one 1mg  tab twice daily.       _____________________________________  Nutrition:                                  Diet Instructions     Discharge diet (specify)     Discharge Nutrition  Therapy: Heart Healthy                     ___________________________________________  Discharge Instructions   Nutrition:  Diet Instructions     Discharge diet (specify)     Discharge Nutrition Therapy: Heart Healthy       Activity:                                   Activity Instructions     Activity as tolerated         Appointments:                         Appointments which have been scheduled for you    Apr 04, 2024 1:20 PM (Arrive by 1:05 PM) RETURN CONTINUITY with Lynwood Con Gasman, MD [facility] [address] [place] [facility] [place]) [address] [place] [address] [phone]         Follow Up:                              Follow Up instructions and Outpatient Referrals    Call MD for:  difficulty breathing, headache or visual disturbances     Call MD for:  extreme fatigue     Call MD for:  hives     Call MD for:  persistent dizziness or light-headedness     Call MD for:  persistent nausea or vomiting     Call MD for:  severe uncontrolled pain     Call MD for: Temperature > 38.5 Celsius ( > 101.3 Fahrenheit)     Schedule Follow-up visit with Primary Care Physician     Instructions for follow-up: 5-7 days   Reason for referral: Hospital follow up       Allergies  Allergen Reactions  . Morphine Itching     Past Medical History[1]  Past Surgical History[2]   Family History[3]   Current Medications[4]  Imaging  CT chest without contrast Result Date: 02/06/2024 Exam: CT of the Chest without Contrast  History: Evaluate for pneumonia  Technique: Contiguous axial images were obtained through the chest without intravenous contrast. Multiplanar and maximum intensity projection axial reformats were obtained. AEC (automated exposure control) and/or manual techniques such as size-specific kV and mAs are employed where appropriate to reduce radiation exposure for all  CT exams.  Comparison: None.   Findings: LUNGS: Faint scattered, mosaic groundglass opacities are evident, most prominent in the right upper lobe. The findings are nonspecific but can represent an atypical or viral infection. There is no lobar consolidation.  PLEURA: No pleural effusion or pneumothorax.  HEART: Normal heart size. No pericardial effusion. Moderate atherosclerotic calcifications are evident in the coronary arteries.  GREAT VESSELS: Calcifications are evident in the aorta without evidence of aneurysmal dilation.  MEDIASTINUM: No lymphadenopathy.  BONES: No aggressive lesion. No acute abnormality.  SOFT TISSUES: Normal.  UPPER ABDOMEN: Limited view of the upper abdomen is unremarkable.     1. Faint scattered groundglass pulmonary opacities, nonspecific but can represent atypical or viral infection  2. Otherwise, negative chest CT. No lobar infiltrate  Signed (Electronic Signature): 02/06/2024 9:52 PM Signed By: Luke Batter   Lab Results   Recent Labs    02/06/24 2037  WBC 14.2*  HGB 10.9*  HCT 31.8*  PLT 253   Recent Labs    02/06/24 2037 02/06/24 2124 02/07/24 0232  NA 136  --   --   K 4.1  --   --  CL 100  --   --   CO2 27.5  --   --   BUN 9  --   --   CREATININE 1.16*  --   --   GLU 103  --   --   CALCIUM 8.5  --   --   ALBUMIN 3.3*  --   --   PROT 6.4  --   --   BILITOT 0.4  --   --   AST 18  --   --   ALT 32  --   --   ALKPHOS 112  --   --   TSH  --   --  1.262  LACTATE  --  0.8  --    Recent Labs    02/06/24 2037  TROPONINI 5   Recent Labs    02/06/24 2227  WBCUA 10*  NITRITE Negative  LEUKOCYTESUR Small*  BACTERIA None Seen  RBCUA 1  BLOODU Negative  GLUCOSEU >1000 mg/dL*  PROTEINUA Negative  KETONESU Negative   Recent Labs    02/06/24 2227  OPIAU Positive*  BENZU Negative  AMPHU Negative  COCAU Negative  CANNAU Negative  BARBU Negative   No results for input(s): PREGTESTUR, PREGPOC in the last 72 hours. No results for  input(s): OCCULTBLD, RAPSCRN, CDIFRPCR, CDIFFNAP1, A1C, CHOL, LDL, HDL, TRIG in the last 72 hours. No results for input(s): O2SOUR, FIO2ART, PHART, PCO2ART, PO2ART, HCO3ART, O2SATART, BEART in the last 72 hours. Pending Labs     Order Current Status   Blood Culture #1 In process   Blood Culture #2 In process   Procalcitonin In process   Respiratory Pathogen Panel In process   Urine Culture In process       Home Medications   Prior to Admission medications  Medication Dose, Route, Frequency  atenolol (TENORMIN) 25 MG tablet 25 mg, Oral, Daily (standard)  budesonide-formoterol (SYMBICORT) 160-4.5 mcg/actuation inhaler 2 puffs, Inhalation, 2 times a day (standard)  cholecalciferol, vitamin D3-125 mcg, 5,000 unit,, 125 mcg (5,000 unit) tablet 1 tablet, Every morning  clopidogrel (PLAVIX) 75 mg tablet 75 mg, Oral, Daily (standard)  docusate sodium (COLACE) 100 MG capsule 100 mg, Oral, 2 times a day PRN  DULoxetine (CYMBALTA) 60 MG capsule 60 mg, Oral, Daily (standard)  empagliflozin (JARDIANCE) 10 mg tablet 10 mg, Oral, Daily (standard)  ezetimibe (ZETIA) 10 mg tablet 10 mg, Oral, Every morning  famotidine (PEPCID) 20 MG tablet 20 mg, Oral, 2 times a day (standard)  fluticasone propionate (FLONASE) 50 mcg/actuation nasal spray 1 spray, Each Nare, 2 times a day (standard)  folic acid (FOLVITE) 1 MG tablet 1 mg, 2 times a day  HYDROcodone-acetaminophen (NORCO) 5-325 mg per tablet 1 tablet, Oral, Daily PRN  lamoTRIgine (LAMICTAL) 25 MG tablet TAKE 2 TABLETS(50 MG) BY MOUTH TWICE DAILY  latanoprost (XALATAN) 0.005 % ophthalmic solution 1 drop, Nightly  lisinopril (PRINIVIL,ZESTRIL) 2.5 MG tablet 2.5 mg, Oral, Daily (standard)  loperamide (IMODIUM A-D) 2 mg tablet 2 mg, Oral, 4 times a day PRN  loratadine (CLARITIN) 10 mg tablet 1 tablet, Every morning  methotrexate 2.5 MG tablet Weekly  omeprazole (PRILOSEC) 40 MG capsule 40 mg, Oral, Daily (standard)   pen needle, diabetic (ABOUTTIME PEN NEEDLE) 31 gauge x 5/16 (8 mm) Ndle   potassium 99 mg Tab 1 tablet, Daily (standard)  QUEtiapine (SEROQUEL) 300 MG tablet 300 mg, Oral, Nightly  rOPINIRole (REQUIP) 4 MG tablet 4 mg, Oral, Nightly  rosuvastatin (CRESTOR) 40 MG tablet 40 mg, Oral, Nightly  semaglutide  (OZEMPIC ) 2 mg/dose (8 mg/3 mL) PnIj 2 mg, Subcutaneous, Every 7 days  umeclidinium (INCRUSE ELLIPTA) 62.5 mcg/actuation inhaler 1 puff, Inhalation, Daily (standard)  varenicline tartrate (CHANTIX PAK) 0.5 mg (11)- 1 mg (42) tablet Take one 0.5mg  tab once daily for 3 days,then increase to one 0.5mg  tab twice daily for 4 days,then increase to one 1mg  tab twice daily.  insulin glargine  (LANTUS ) 100 unit/mL injection 50 Units, Subcutaneous, Nightly  nitroglycerin (NITROSTAT) 0.4 MG SL tablet 0.4 mg, Sublingual, Every 5 min PRN, Maximum of 3 doses in 15 minutes.  pregabalin (LYRICA) 225 MG capsule 225 mg, Oral, 2 times a day (standard)   Donnice SHAUNNA Para, DO Hospitalist, [facility] 02/07/24, 12:49 PM       [1] Past Medical History: Diagnosis Date  . Arthritis   . Asthma (HHS-HCC)   . COPD (chronic obstructive pulmonary disease) (CMS-HCC)   . Depression   . Diabetes mellitus (CMS-HCC)   . Headache   . Hypertension   . Tobacco abuse   [2] Past Surgical History: Procedure Laterality Date  . APPENDECTOMY  02/14/1976  . CHOLECYSTECTOMY    . COLONOSCOPY    . HYSTERECTOMY    [3] Family History Problem Relation Age of Onset  . Cancer Mother   . COPD Mother   . Diabetes Mother   . Asthma Father   . Cancer Father   . Hypertension Father   . Diabetes Sister   . Heart disease Sister   . Diabetes Brother   . Hypertension Brother   . Cancer Maternal Grandfather   . Alzheimer's disease Maternal Grandmother   . Parkinsonism Maternal Grandmother   . Stroke Maternal Grandmother   . Cancer Maternal Aunt   . Cancer Maternal Uncle   . Cancer Maternal Uncle   . Diabetes Sister   . Heart  disease Sister        Heart murrer  . Diabetes Brother   . Hypertension Brother   . Neurofibromatosis Sister   [4]  Current Facility-Administered Medications:  .  acetaminophen (TYLENOL) tablet 650 mg, 650 mg, Oral, Q4H PRN, Verdie Darin Shelvy Lonni, MD .  acetaminophen (TYLENOL) tablet 975 mg, 975 mg, Oral, Q8H SCH, Dyer, Andre St. Christopher, MD, 975 mg at 02/07/24 [phone] .  albuterol 2.5 mg /3 mL (0.083 %) nebulizer solution 2.5 mg, 2.5 mg, Nebulization, Q4H PRN, Verdie Darin Shelvy Lonni, MD .  aluminum-magnesium hydroxide-simethicone (MAALOX MAX) 80-80-8 mg/mL oral suspension, 30 mL, Oral, Q4H PRN, Verdie Darin Shelvy Lonni, MD .  [Provider Hold] atenolol (TENORMIN) tablet 25 mg, 25 mg, Oral, Daily, Verdie, Andre St. Christopher, MD .  atorvastatin (LIPITOR) tablet 80 mg, 80 mg, Oral, Nightly, Dyer, Andre St. Christopher, MD .  cefTRIAXone (ROCEPHIN) IVPB 1 g in 50 mL dextrose (premix), 1 g, Intravenous, Q24H, Verdie Darin Shelvy Lonni, MD, Stopped at 02/07/24 1038 .  clopidogrel (PLAVIX) tablet 75 mg, 75 mg, Oral, Daily, Verdie Darin Shelvy Lonni, MD, 75 mg at 02/07/24 [phone] .  [Provider Hold] DULoxetine (CYMBALTA) DR capsule 60 mg, 60 mg, Oral, Daily, Verdie, Andre St. Christopher, MD .  NOREEN ON 02/08/2024] enoxaparin (LOVENOX) syringe 40 mg, 40 mg, Subcutaneous, Q24H, Para Donnice Pastor, DO .  ezetimibe (ZETIA) tablet 10 mg, 10 mg, Oral, QAM, Dyer, Andre St. Christopher, MD, 10 mg at 02/07/24 [phone] .  famotidine (PEPCID) tablet 20 mg, 20 mg, Oral, BID, Verdie Andre St. Christopher, MD, 20 mg at 02/07/24 0850 .  fluticasone furoate-vilanterol (BREO ELLIPTA) 100-25 mcg/dose inhaler 1 puff, 1  puff, Inhalation, Daily (RT), Verdie Darin Shelvy Lonni, MD, 1 puff at 02/07/24 [phone] .  fluticasone propionate (FLONASE) 50 mcg/actuation nasal spray 1 spray, 1 spray, Each Nare, BID, Dyer, Andre St. Christopher, MD, 1 spray at 02/07/24 [phone] .  guaiFENesin (ROBITUSSIN) oral syrup, 200 mg,  Oral, Q4H PRN, Verdie Darin Shelvy Lonni, MD .  [Provider Hold] HYDROcodone-acetaminophen [facility]) 5-325 mg per tablet 1 tablet, 1 tablet, Oral, Daily PRN, Verdie Darin Shelvy Lonni, MD .  insulin glargine  (LANTUS ) injection BASAL 40 Units, 40 Units, Subcutaneous, Nightly, Verdie Darin Shelvy Lonni, MD .  lamoTRIgine (LaMICtal) tablet 50 mg, 50 mg, Oral, BID, Dyer, Andre St. Christopher, MD, 50 mg at 02/07/24 [phone] .  latanoprost (XALATAN) 0.005 % ophthalmic solution 1 drop, 1 drop, Left Eye, Nightly, Verdie, Darin Shelvy Lonni, MD .  [Provider Hold] lisinopril (PRINIVIL,ZESTRIL) tablet 2.5 mg, 2.5 mg, Oral, Daily, Verdie Andre St. Christopher, MD .  loratadine [facility]) tablet 10 mg, 10 mg, Oral, QAM, Dyer, Andre St. Christopher, MD, 10 mg at 02/07/24 [phone] .  magnesium citrate solution 120 mL, 120 mL, Oral, Daily PRN, Verdie Darin Shelvy Lonni, MD .  melatonin tablet 3 mg, 3 mg, Oral, Nightly PRN, Verdie Darin Shelvy Lonni, MD .  naloxone (NARCAN) injection 0.1 mg, 0.1 mg, Intravenous, Q5 Min PRN, Verdie Darin Shelvy Lonni, MD .  nicotine (NICODERM CQ) 21 mg/24 hr patch 1 patch, 1 patch, Transdermal, Daily, Verdie Darin Shelvy Lonni, MD, 1 patch at 02/07/24 [phone] .  nicotine polacrilex (NICORETTE) gum 4 mg, 4 mg, Buccal, Q1H PRN, Verdie Darin Shelvy Lonni, MD .  ondansetron (ZOFRAN) injection 4 mg, 4 mg, Intravenous, Q8H PRN **OR** ondansetron (ZOFRAN) injection 8 mg, 8 mg, Intravenous, Q8H PRN, Verdie Darin Shelvy Lonni, MD .  pantoprazole (Protonix) EC tablet 20 mg, 20 mg, Oral, Daily before breakfast, Verdie Darin Shelvy Lonni, MD, 20 mg at 02/07/24 [phone] .  polyethylene glycol (MIRALAX) packet 17 g, 17 g, Oral, Daily PRN, Verdie Darin Shelvy Lonni, MD .  pregabalin (LYRICA) capsule 225 mg, 225 mg, Oral, BID, Dyer, Andre St. Christopher, MD, 225 mg at 02/07/24 [phone] .  prochlorperazine (COMPAZINE) injection 2.5 mg, 2.5 mg, Intravenous, Q8H PRN **OR** prochlorperazine (COMPAZINE)  injection 5 mg, 5 mg, Intravenous, Q8H PRN, Verdie Darin Shelvy Lonni, MD .  promethazine (PHENERGAN) suppository 12.5 mg, 12.5 mg, Rectal, Q6H PRN **OR** promethazine (PHENERGAN) suppository 25 mg, 25 mg, Rectal, Q6H PRN, Verdie Darin Shelvy Lonni, MD .  [Provider Hold] QUEtiapine (SEROQUEL) tablet 300 mg, 300 mg, Oral, Nightly, Verdie, Andre St. Christopher, MD .  rOPINIRole (REQUIP) tablet 4 mg, 4 mg, Oral, Nightly, Verdie, Andre St. Christopher, MD .  senna-docusate (PERICOLACE) 8.6-50 mg 1 tablet, 1 tablet, Oral, Nightly, Verdie Darin Shelvy Lonni, MD .  sodium chloride  (NS) 0.9 % infusion, 100 mL/hr, Intravenous, Continuous, Verdie Darin Shelvy Lonni, MD, Last Rate: 100 mL/hr at 02/07/24 0859, 100 mL/hr at 02/07/24 0859 .  umeclidinium (INCRUSE ELLIPTA) 62.5 mcg/actuation inhaler 1 puff, 1 puff, Inhalation, Daily (RT), Verdie Darin Shelvy Lonni, MD, 1 puff at 02/07/24 [phone]

## 2024-02-08 ENCOUNTER — Telehealth: Payer: Self-pay

## 2024-02-08 NOTE — Transitions of Care (Post Inpatient/ED Visit) (Signed)
   02/08/2024  Name: Molly Oneill MRN: [id number] DOB: Jul 08, 1960  Today's TOC FU Call Status: Today's TOC FU Call Status:: Unsuccessful Call (1st Attempt) Unsuccessful Call (1st Attempt) Date: 02/08/24  Attempted to reach the patient regarding the most recent Inpatient/ED visit.  Follow Up Plan: Additional outreach attempts will be made to reach the patient to complete the Transitions of Care (Post Inpatient/ED visit) call.   Alan Ee, RN, BSN, CEN [employer]- Transition of Care Team.  [facility] [phone]

## 2024-02-09 ENCOUNTER — Telehealth: Payer: Self-pay | Admitting: *Deleted

## 2024-02-09 NOTE — Transitions of Care (Post Inpatient/ED Visit) (Signed)
   02/09/2024  Name: Molly Oneill MRN: [id number] DOB: Sep 13, 1960  Today's TOC FU Call Status: Today's TOC FU Call Status:: Unsuccessful Call (2nd Attempt) Unsuccessful Call (2nd Attempt) Date: 02/09/24  Attempted to reach the patient regarding the most recent Inpatient/ED visit.  Follow Up Plan: Additional outreach attempts will be made to reach the patient to complete the Transitions of Care (Post Inpatient/ED visit) call.   Mliss Creed [facility], BSN RN Care Manager/ Transition of Care [place]/ [facility] [phone]

## 2024-02-10 ENCOUNTER — Telehealth: Payer: Self-pay | Admitting: *Deleted

## 2024-02-10 NOTE — Transitions of Care (Post Inpatient/ED Visit) (Signed)
   02/10/2024  Name: Molly Oneill MRN: [id number] DOB: 07/31/1960  Today's TOC FU Call Status: Today's TOC FU Call Status:: Unsuccessful Call (3rd Attempt) Unsuccessful Call (3rd Attempt) Date: 02/10/24  Attempted to reach the patient regarding the most recent Inpatient/ED visit.  Follow Up Plan: No further outreach attempts will be made at this time. We have been unable to contact the patient.  Mliss Creed [facility], BSN RN Care Manager/ Transition of Care [place]/ [facility] [phone]
